# Patient Record
Sex: Male | Born: 1965 | Race: White | Hispanic: No | Marital: Married
Health system: Southern US, Community
[De-identification: ages and names within clinical notes are randomized; demographics above are authoritative.]

## PROBLEM LIST (undated history)

## (undated) DIAGNOSIS — I10 Essential (primary) hypertension: Secondary | ICD-10-CM

## (undated) DIAGNOSIS — F900 Attention-deficit hyperactivity disorder, predominantly inattentive type: Secondary | ICD-10-CM

## (undated) DIAGNOSIS — M51369 Other intervertebral disc degeneration, lumbar region without mention of lumbar back pain or lower extremity pain: Secondary | ICD-10-CM

## (undated) DIAGNOSIS — F439 Reaction to severe stress, unspecified: Secondary | ICD-10-CM

## (undated) DIAGNOSIS — E291 Testicular hypofunction: Secondary | ICD-10-CM

## (undated) DIAGNOSIS — E785 Hyperlipidemia, unspecified: Secondary | ICD-10-CM

## (undated) DIAGNOSIS — Z8639 Personal history of other endocrine, nutritional and metabolic disease: Secondary | ICD-10-CM

## (undated) DIAGNOSIS — E7800 Pure hypercholesterolemia, unspecified: Secondary | ICD-10-CM

## (undated) DIAGNOSIS — R7989 Other specified abnormal findings of blood chemistry: Secondary | ICD-10-CM

## (undated) DIAGNOSIS — M109 Gout, unspecified: Secondary | ICD-10-CM

## (undated) DIAGNOSIS — N400 Enlarged prostate without lower urinary tract symptoms: Secondary | ICD-10-CM

## (undated) DIAGNOSIS — G4733 Obstructive sleep apnea (adult) (pediatric): Secondary | ICD-10-CM

## (undated) DIAGNOSIS — R7303 Prediabetes: Secondary | ICD-10-CM

## (undated) DIAGNOSIS — N529 Male erectile dysfunction, unspecified: Secondary | ICD-10-CM

## (undated) HISTORY — DX: Personal history of other endocrine, nutritional and metabolic disease: Z86.39

## (undated) HISTORY — DX: Other intervertebral disc degeneration, lumbar region without mention of lumbar back pain or lower extremity pain: M51.369

## (undated) HISTORY — DX: Male erectile dysfunction, unspecified: N52.9

## (undated) HISTORY — DX: Prediabetes: R73.03

## (undated) HISTORY — DX: Essential (primary) hypertension: I10

## (undated) HISTORY — DX: Attention-deficit hyperactivity disorder, predominantly inattentive type: F90.0

## (undated) HISTORY — DX: Benign prostatic hyperplasia without lower urinary tract symptoms: N40.0

## (undated) HISTORY — DX: Reaction to severe stress, unspecified: F43.9

## (undated) HISTORY — DX: Obstructive sleep apnea (adult) (pediatric): G47.33

## (undated) HISTORY — DX: Other specified abnormal findings of blood chemistry: R79.89

## (undated) HISTORY — DX: Gout, unspecified: M10.9

## (undated) HISTORY — DX: Pure hypercholesterolemia, unspecified: E78.00

## (undated) HISTORY — DX: Hyperlipidemia, unspecified: E78.5

## (undated) HISTORY — DX: Morbid (severe) obesity due to excess calories: E66.01

## (undated) HISTORY — DX: Testicular hypofunction: E29.1

---

## 2001-07-18 ENCOUNTER — Emergency Department (HOSPITAL_COMMUNITY): Admission: EM | Admit: 2001-07-18 | Discharge: 2001-07-18 | Payer: Self-pay | Admitting: *Deleted

## 2013-09-30 ENCOUNTER — Other Ambulatory Visit: Payer: Self-pay | Admitting: Family Medicine

## 2013-09-30 ENCOUNTER — Ambulatory Visit
Admission: RE | Admit: 2013-09-30 | Discharge: 2013-09-30 | Disposition: A | Discharge status: Home / Self Care | Payer: BC Managed Care – PPO | Source: Ambulatory Visit | Attending: Family Medicine | Admitting: Family Medicine

## 2013-09-30 DIAGNOSIS — M5431 Sciatica, right side: Secondary | ICD-10-CM

## 2014-05-20 ENCOUNTER — Other Ambulatory Visit: Payer: Self-pay | Admitting: Family Medicine

## 2014-05-20 DIAGNOSIS — M5431 Sciatica, right side: Secondary | ICD-10-CM

## 2014-05-31 ENCOUNTER — Ambulatory Visit
Admission: RE | Admit: 2014-05-31 | Discharge: 2014-05-31 | Disposition: A | Discharge status: Home / Self Care | Payer: 59 | Source: Ambulatory Visit | Attending: Family Medicine | Admitting: Family Medicine

## 2014-05-31 DIAGNOSIS — M5431 Sciatica, right side: Secondary | ICD-10-CM

## 2015-08-05 DIAGNOSIS — M4726 Other spondylosis with radiculopathy, lumbar region: Secondary | ICD-10-CM | POA: Diagnosis not present

## 2015-08-05 DIAGNOSIS — M4696 Unspecified inflammatory spondylopathy, lumbar region: Secondary | ICD-10-CM | POA: Diagnosis not present

## 2015-08-05 DIAGNOSIS — Z6836 Body mass index (BMI) 36.0-36.9, adult: Secondary | ICD-10-CM | POA: Diagnosis not present

## 2015-08-05 DIAGNOSIS — I10 Essential (primary) hypertension: Secondary | ICD-10-CM | POA: Diagnosis not present

## 2015-08-17 DIAGNOSIS — M47816 Spondylosis without myelopathy or radiculopathy, lumbar region: Secondary | ICD-10-CM | POA: Diagnosis not present

## 2015-08-17 DIAGNOSIS — Z6836 Body mass index (BMI) 36.0-36.9, adult: Secondary | ICD-10-CM | POA: Diagnosis not present

## 2015-08-17 DIAGNOSIS — I10 Essential (primary) hypertension: Secondary | ICD-10-CM | POA: Diagnosis not present

## 2015-09-02 DIAGNOSIS — Z6836 Body mass index (BMI) 36.0-36.9, adult: Secondary | ICD-10-CM | POA: Diagnosis not present

## 2015-09-02 DIAGNOSIS — M4726 Other spondylosis with radiculopathy, lumbar region: Secondary | ICD-10-CM | POA: Diagnosis not present

## 2015-09-02 DIAGNOSIS — I10 Essential (primary) hypertension: Secondary | ICD-10-CM | POA: Diagnosis not present

## 2015-09-16 DIAGNOSIS — M47816 Spondylosis without myelopathy or radiculopathy, lumbar region: Secondary | ICD-10-CM | POA: Diagnosis not present

## 2015-09-30 DIAGNOSIS — G4733 Obstructive sleep apnea (adult) (pediatric): Secondary | ICD-10-CM | POA: Diagnosis not present

## 2015-12-21 DIAGNOSIS — Z23 Encounter for immunization: Secondary | ICD-10-CM | POA: Diagnosis not present

## 2015-12-21 DIAGNOSIS — Z0000 Encounter for general adult medical examination without abnormal findings: Secondary | ICD-10-CM | POA: Diagnosis not present

## 2015-12-21 DIAGNOSIS — M109 Gout, unspecified: Secondary | ICD-10-CM | POA: Diagnosis not present

## 2015-12-21 DIAGNOSIS — Z125 Encounter for screening for malignant neoplasm of prostate: Secondary | ICD-10-CM | POA: Diagnosis not present

## 2015-12-21 DIAGNOSIS — E291 Testicular hypofunction: Secondary | ICD-10-CM | POA: Diagnosis not present

## 2015-12-21 DIAGNOSIS — E785 Hyperlipidemia, unspecified: Secondary | ICD-10-CM | POA: Diagnosis not present

## 2015-12-21 DIAGNOSIS — I10 Essential (primary) hypertension: Secondary | ICD-10-CM | POA: Diagnosis not present

## 2016-03-16 DIAGNOSIS — R635 Abnormal weight gain: Secondary | ICD-10-CM | POA: Diagnosis not present

## 2016-03-16 DIAGNOSIS — E291 Testicular hypofunction: Secondary | ICD-10-CM | POA: Diagnosis not present

## 2016-03-17 DIAGNOSIS — I10 Essential (primary) hypertension: Secondary | ICD-10-CM | POA: Diagnosis not present

## 2016-03-17 DIAGNOSIS — R7303 Prediabetes: Secondary | ICD-10-CM | POA: Diagnosis not present

## 2016-03-17 DIAGNOSIS — E291 Testicular hypofunction: Secondary | ICD-10-CM | POA: Diagnosis not present

## 2016-03-17 DIAGNOSIS — E538 Deficiency of other specified B group vitamins: Secondary | ICD-10-CM | POA: Diagnosis not present

## 2016-03-17 DIAGNOSIS — E8881 Metabolic syndrome: Secondary | ICD-10-CM | POA: Diagnosis not present

## 2016-03-23 DIAGNOSIS — E291 Testicular hypofunction: Secondary | ICD-10-CM | POA: Diagnosis not present

## 2016-04-22 DIAGNOSIS — E291 Testicular hypofunction: Secondary | ICD-10-CM | POA: Diagnosis not present

## 2016-04-22 DIAGNOSIS — R7303 Prediabetes: Secondary | ICD-10-CM | POA: Diagnosis not present

## 2016-04-22 DIAGNOSIS — E538 Deficiency of other specified B group vitamins: Secondary | ICD-10-CM | POA: Diagnosis not present

## 2016-04-22 DIAGNOSIS — E559 Vitamin D deficiency, unspecified: Secondary | ICD-10-CM | POA: Diagnosis not present

## 2016-04-22 DIAGNOSIS — E8881 Metabolic syndrome: Secondary | ICD-10-CM | POA: Diagnosis not present

## 2016-04-29 DIAGNOSIS — E8881 Metabolic syndrome: Secondary | ICD-10-CM | POA: Diagnosis not present

## 2016-04-29 DIAGNOSIS — E538 Deficiency of other specified B group vitamins: Secondary | ICD-10-CM | POA: Diagnosis not present

## 2016-04-29 DIAGNOSIS — E291 Testicular hypofunction: Secondary | ICD-10-CM | POA: Diagnosis not present

## 2016-05-09 DIAGNOSIS — G4733 Obstructive sleep apnea (adult) (pediatric): Secondary | ICD-10-CM | POA: Diagnosis not present

## 2016-05-17 DIAGNOSIS — G4733 Obstructive sleep apnea (adult) (pediatric): Secondary | ICD-10-CM | POA: Diagnosis not present

## 2016-06-14 DIAGNOSIS — G4733 Obstructive sleep apnea (adult) (pediatric): Secondary | ICD-10-CM | POA: Diagnosis not present

## 2016-06-20 DIAGNOSIS — F900 Attention-deficit hyperactivity disorder, predominantly inattentive type: Secondary | ICD-10-CM | POA: Diagnosis not present

## 2016-06-20 DIAGNOSIS — E7800 Pure hypercholesterolemia, unspecified: Secondary | ICD-10-CM | POA: Diagnosis not present

## 2016-06-20 DIAGNOSIS — E291 Testicular hypofunction: Secondary | ICD-10-CM | POA: Diagnosis not present

## 2016-06-20 DIAGNOSIS — I10 Essential (primary) hypertension: Secondary | ICD-10-CM | POA: Diagnosis not present

## 2016-06-28 DIAGNOSIS — E291 Testicular hypofunction: Secondary | ICD-10-CM | POA: Diagnosis not present

## 2016-06-28 DIAGNOSIS — R945 Abnormal results of liver function studies: Secondary | ICD-10-CM | POA: Diagnosis not present

## 2016-06-28 DIAGNOSIS — R7301 Impaired fasting glucose: Secondary | ICD-10-CM | POA: Diagnosis not present

## 2016-06-28 DIAGNOSIS — I10 Essential (primary) hypertension: Secondary | ICD-10-CM | POA: Diagnosis not present

## 2016-07-13 DIAGNOSIS — E291 Testicular hypofunction: Secondary | ICD-10-CM | POA: Diagnosis not present

## 2016-07-15 DIAGNOSIS — G4733 Obstructive sleep apnea (adult) (pediatric): Secondary | ICD-10-CM | POA: Diagnosis not present

## 2016-08-08 DIAGNOSIS — G4733 Obstructive sleep apnea (adult) (pediatric): Secondary | ICD-10-CM | POA: Diagnosis not present

## 2016-08-14 DIAGNOSIS — G4733 Obstructive sleep apnea (adult) (pediatric): Secondary | ICD-10-CM | POA: Diagnosis not present

## 2016-09-14 DIAGNOSIS — G4733 Obstructive sleep apnea (adult) (pediatric): Secondary | ICD-10-CM | POA: Diagnosis not present

## 2016-10-11 IMAGING — MR MR LUMBAR SPINE W/O CM
4 of 5 series · 18 of 48 positions shown · non-contrast
Comparison: 09/30/2013

CLINICAL DATA: Low back pain and weakness. Pain extending down the
right leg for 1 year.

EXAM:
MRI LUMBAR SPINE WITHOUT CONTRAST
TECHNIQUE: Multiplanar, multisequence MR imaging of the lumbar spine was
performed. No intravenous contrast was administered.

[Series 3: T1 · sagittal · 4.0mm · 0.84mm/px · 3 of 14 slices shown (1 of 2)]
[im 1/14]
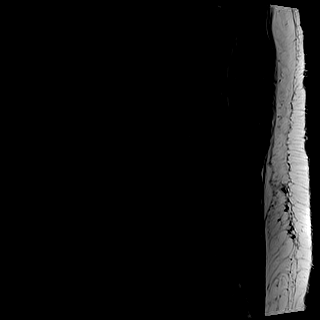
[im 7/14]
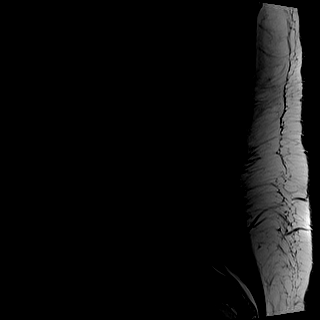
[im 14/14]
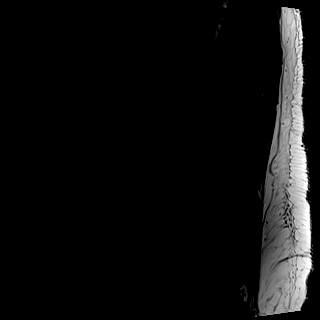

[Series 4: T2 · sagittal · 4.0mm · 0.42mm/px · 5 of 14 slices shown (1 of 2)]
[im 1/14]
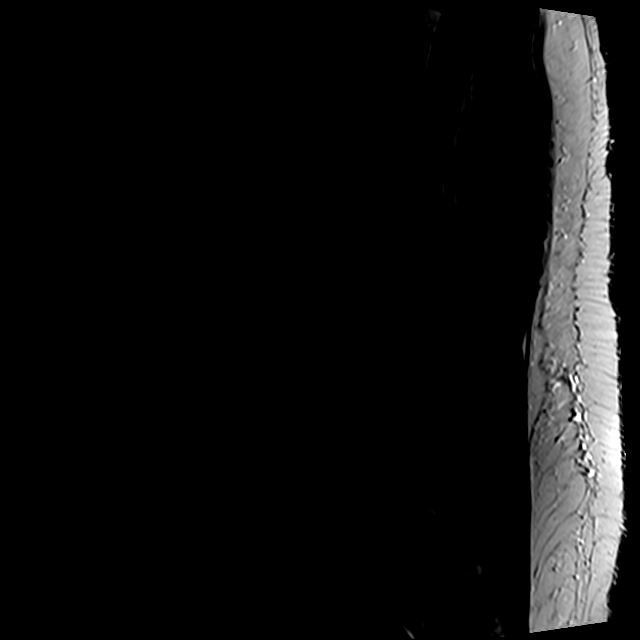
[im 4/14]
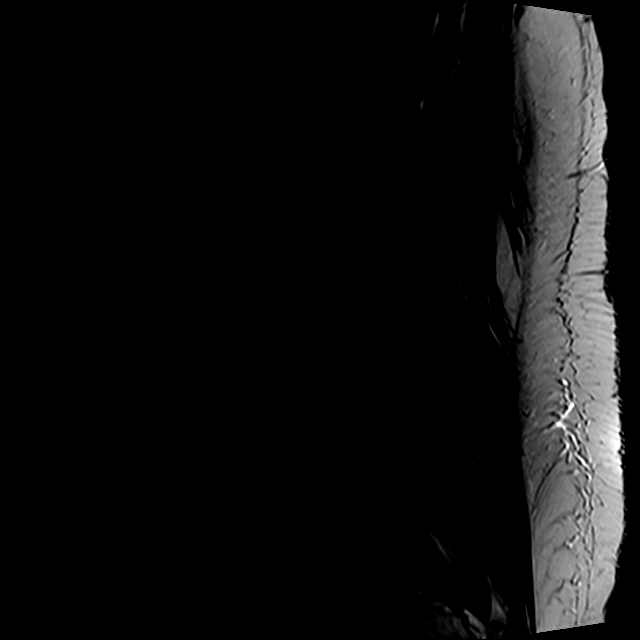
[im 7/14]
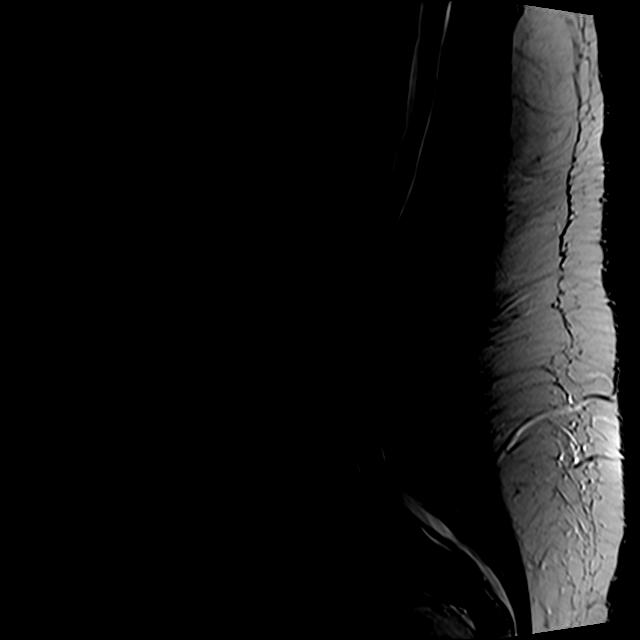
[im 10/14]
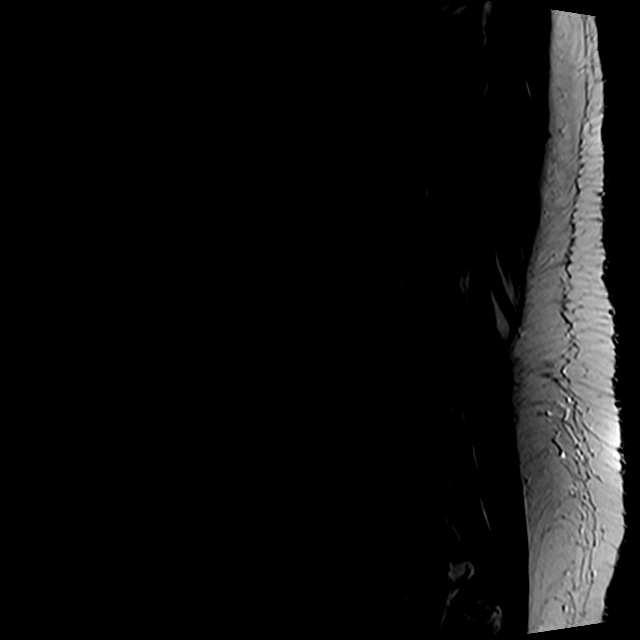
[im 14/14]
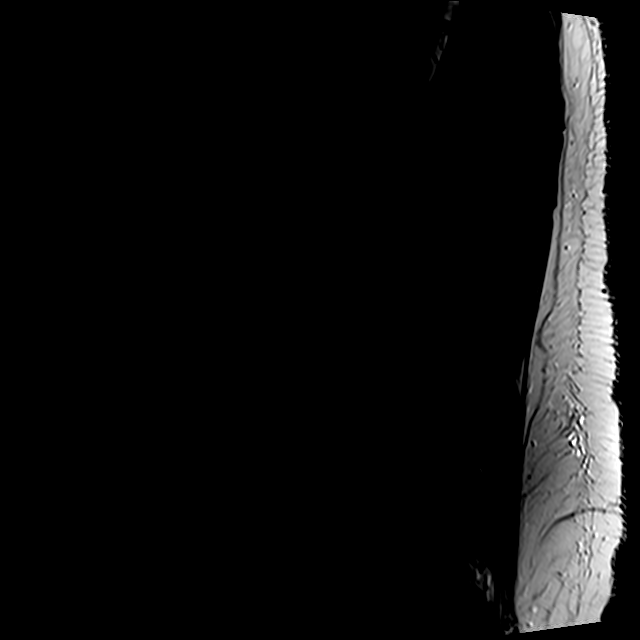

[Series 5: T2 · axial · 4.0mm · 0.39mm/px · z∈[-37,+134]mm · 7 of 40 slices shown (2 of 2)]
[im 3/40]
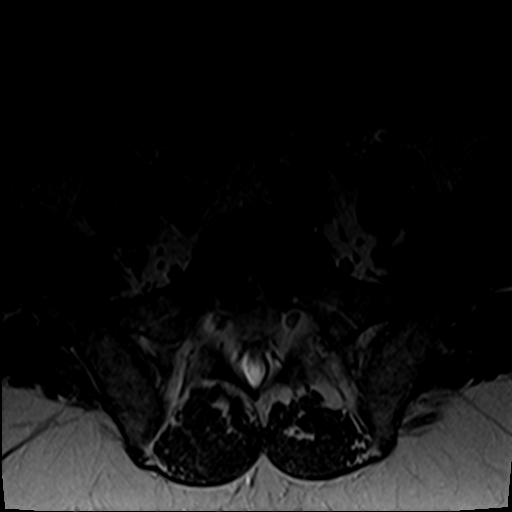
[im 6/40]
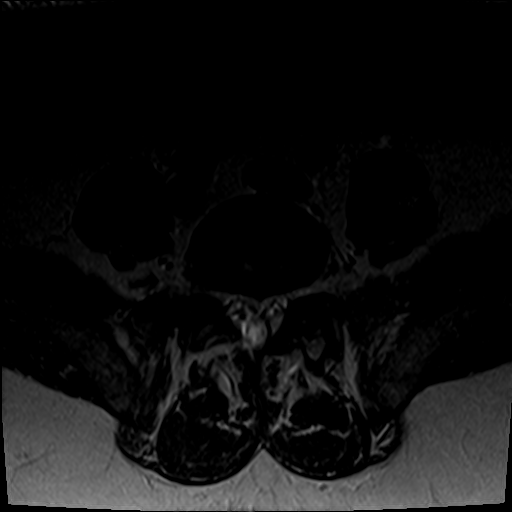
[im 8/40]
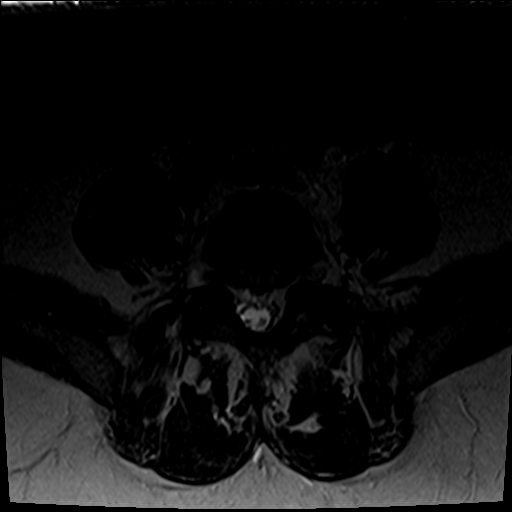
[im 14/40]
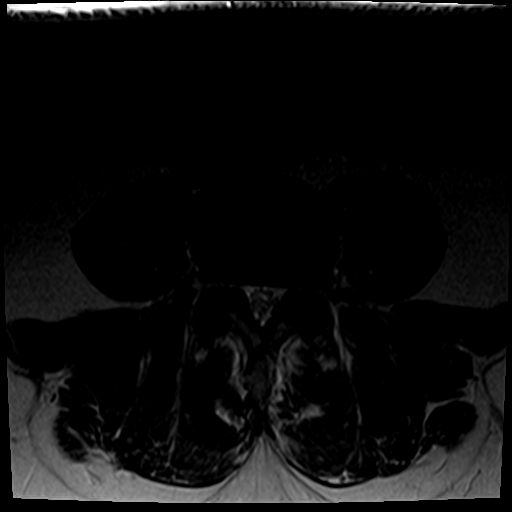
[im 19/40]
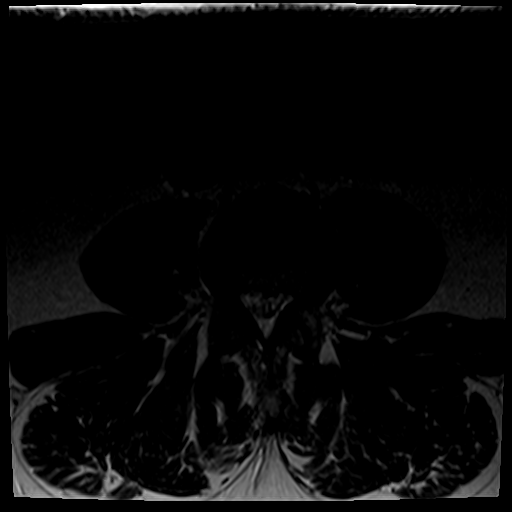
[im 21/40]
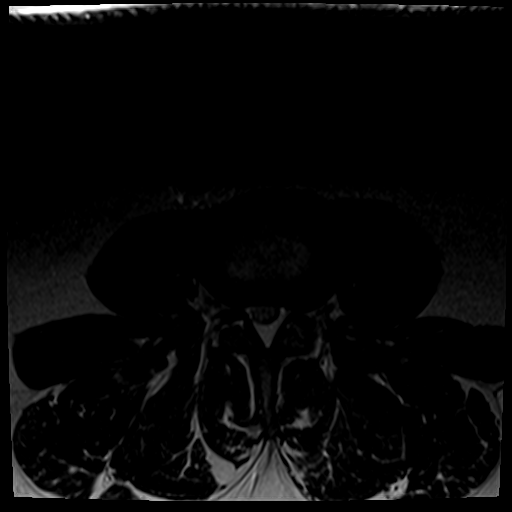
[im 34/40]
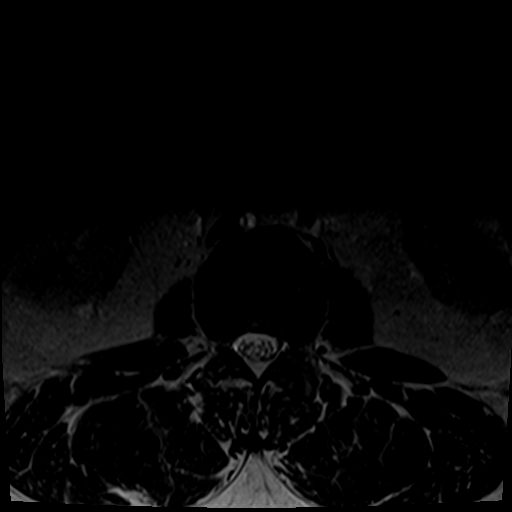

[Series 7: T1 · axial · 4.0mm · 0.39mm/px · z∈[-22,+134]mm · 3 of 40 slices shown (2 of 2)]
[im 6/40]
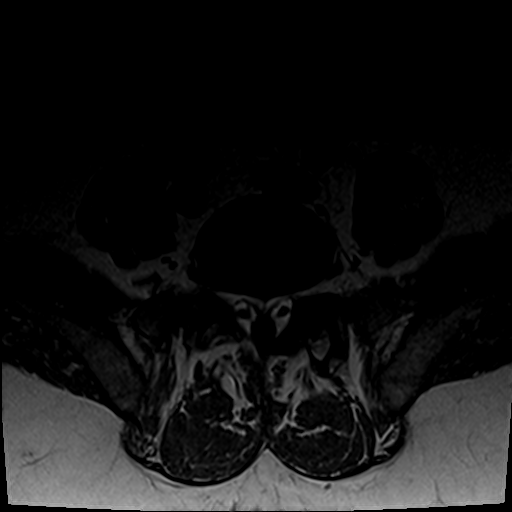
[im 21/40]
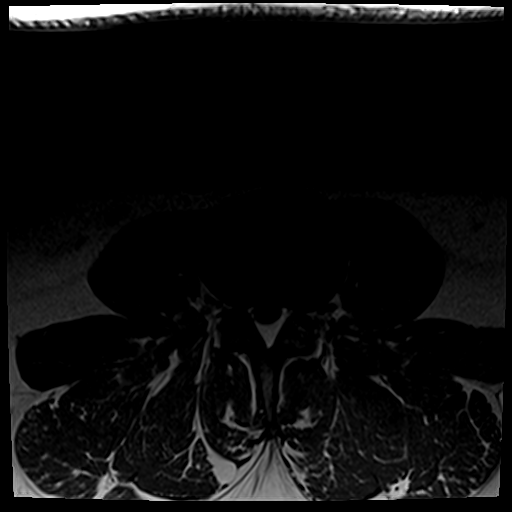
[im 34/40]
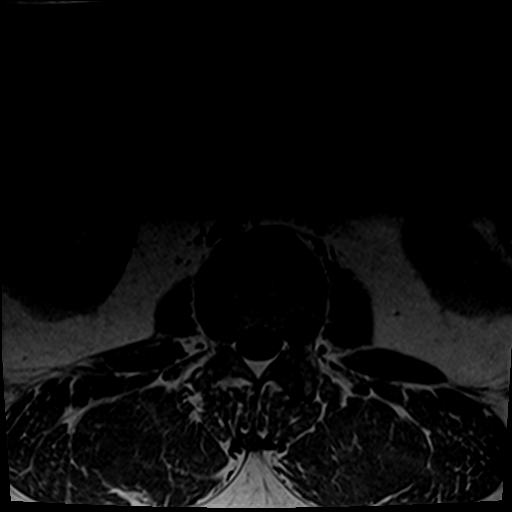

[18 of 48 positions shown; findings below may reference images not displayed]

FINDINGS: The lowest lumbar type non-rib-bearing vertebra is labeled as L5.
The conus medullaris appears normal. Conus level: L1.

Mild disc desiccation at L4-5 and L5-S 1, without loss of disc
height.

Degenerative facet edema bilaterally at L4-5. Otherwise, No
significant vertebral marrow edema is identified.

Mildly prominent epidural adipose tissues throughout the lumbar
spine, with congenitally short pedicles in the lumbar spine.

There is borderline left foraminal stenosis at T12-L1 due to facet
arthropathy. Additional findings at individual levels are as
follows:

L1-2:  Unremarkable.

L2-3: Mild central narrowing of the thecal sac due to the
congenitally short pedicles.

L3-4: Moderate central narrowing of the thecal sac due to disc bulge
and congenitally short pedicles.

L4-5: Moderate bilateral foraminal stenosis and mild central
narrowing of the thecal sac with mild bilateral subarticular lateral
recess stenosis due to is bulge, facet arthropathy, and congenitally
short pedicles.

L5-S1: Moderate left and mild right foraminal stenosis due to left
greater than right facet arthropathy and a mild disc bulge.
IMPRESSION: 1. Congenitally short pedicles, lumbar spondylosis, and degenerative
disc disease cause moderate impingement at L3-4, L4-5, and L5-S1;
and mild impingement at L2-3, as detailed above.

## 2016-10-14 DIAGNOSIS — G4733 Obstructive sleep apnea (adult) (pediatric): Secondary | ICD-10-CM | POA: Diagnosis not present

## 2016-10-17 DIAGNOSIS — I10 Essential (primary) hypertension: Secondary | ICD-10-CM | POA: Diagnosis not present

## 2016-10-17 DIAGNOSIS — E291 Testicular hypofunction: Secondary | ICD-10-CM | POA: Diagnosis not present

## 2016-10-17 DIAGNOSIS — E7800 Pure hypercholesterolemia, unspecified: Secondary | ICD-10-CM | POA: Diagnosis not present

## 2016-10-17 DIAGNOSIS — Z23 Encounter for immunization: Secondary | ICD-10-CM | POA: Diagnosis not present

## 2016-10-17 DIAGNOSIS — F900 Attention-deficit hyperactivity disorder, predominantly inattentive type: Secondary | ICD-10-CM | POA: Diagnosis not present

## 2016-10-17 DIAGNOSIS — E119 Type 2 diabetes mellitus without complications: Secondary | ICD-10-CM | POA: Diagnosis not present

## 2016-11-17 DIAGNOSIS — E291 Testicular hypofunction: Secondary | ICD-10-CM | POA: Diagnosis not present

## 2017-01-24 DIAGNOSIS — G4733 Obstructive sleep apnea (adult) (pediatric): Secondary | ICD-10-CM | POA: Diagnosis not present

## 2017-03-10 DIAGNOSIS — Z23 Encounter for immunization: Secondary | ICD-10-CM | POA: Diagnosis not present

## 2017-05-16 DIAGNOSIS — I10 Essential (primary) hypertension: Secondary | ICD-10-CM | POA: Diagnosis not present

## 2017-05-16 DIAGNOSIS — Z125 Encounter for screening for malignant neoplasm of prostate: Secondary | ICD-10-CM | POA: Diagnosis not present

## 2017-05-16 DIAGNOSIS — E7800 Pure hypercholesterolemia, unspecified: Secondary | ICD-10-CM | POA: Diagnosis not present

## 2017-05-16 DIAGNOSIS — Z0000 Encounter for general adult medical examination without abnormal findings: Secondary | ICD-10-CM | POA: Diagnosis not present

## 2017-05-16 DIAGNOSIS — E291 Testicular hypofunction: Secondary | ICD-10-CM | POA: Diagnosis not present

## 2017-05-16 DIAGNOSIS — E119 Type 2 diabetes mellitus without complications: Secondary | ICD-10-CM | POA: Diagnosis not present

## 2017-07-07 DIAGNOSIS — K640 First degree hemorrhoids: Secondary | ICD-10-CM | POA: Diagnosis not present

## 2017-07-07 DIAGNOSIS — D123 Benign neoplasm of transverse colon: Secondary | ICD-10-CM | POA: Diagnosis not present

## 2017-07-07 DIAGNOSIS — Z86010 Personal history of colonic polyps: Secondary | ICD-10-CM | POA: Diagnosis not present

## 2017-08-09 DIAGNOSIS — G4733 Obstructive sleep apnea (adult) (pediatric): Secondary | ICD-10-CM | POA: Diagnosis not present

## 2017-11-14 DIAGNOSIS — M109 Gout, unspecified: Secondary | ICD-10-CM | POA: Diagnosis not present

## 2017-11-14 DIAGNOSIS — E7800 Pure hypercholesterolemia, unspecified: Secondary | ICD-10-CM | POA: Diagnosis not present

## 2017-11-14 DIAGNOSIS — E1169 Type 2 diabetes mellitus with other specified complication: Secondary | ICD-10-CM | POA: Diagnosis not present

## 2017-11-14 DIAGNOSIS — E291 Testicular hypofunction: Secondary | ICD-10-CM | POA: Diagnosis not present

## 2017-11-14 DIAGNOSIS — I10 Essential (primary) hypertension: Secondary | ICD-10-CM | POA: Diagnosis not present

## 2018-06-18 DIAGNOSIS — E7800 Pure hypercholesterolemia, unspecified: Secondary | ICD-10-CM | POA: Diagnosis not present

## 2018-06-18 DIAGNOSIS — N529 Male erectile dysfunction, unspecified: Secondary | ICD-10-CM | POA: Diagnosis not present

## 2018-06-18 DIAGNOSIS — E291 Testicular hypofunction: Secondary | ICD-10-CM | POA: Diagnosis not present

## 2018-06-18 DIAGNOSIS — Z0000 Encounter for general adult medical examination without abnormal findings: Secondary | ICD-10-CM | POA: Diagnosis not present

## 2018-06-18 DIAGNOSIS — E1169 Type 2 diabetes mellitus with other specified complication: Secondary | ICD-10-CM | POA: Diagnosis not present

## 2018-06-18 DIAGNOSIS — I10 Essential (primary) hypertension: Secondary | ICD-10-CM | POA: Diagnosis not present

## 2018-06-18 DIAGNOSIS — Z125 Encounter for screening for malignant neoplasm of prostate: Secondary | ICD-10-CM | POA: Diagnosis not present

## 2018-07-03 DIAGNOSIS — R972 Elevated prostate specific antigen [PSA]: Secondary | ICD-10-CM | POA: Diagnosis not present

## 2018-07-03 DIAGNOSIS — N486 Induration penis plastica: Secondary | ICD-10-CM | POA: Diagnosis not present

## 2018-07-03 DIAGNOSIS — N5201 Erectile dysfunction due to arterial insufficiency: Secondary | ICD-10-CM | POA: Diagnosis not present

## 2018-08-15 DIAGNOSIS — G4733 Obstructive sleep apnea (adult) (pediatric): Secondary | ICD-10-CM | POA: Diagnosis not present

## 2018-10-01 DIAGNOSIS — G4733 Obstructive sleep apnea (adult) (pediatric): Secondary | ICD-10-CM | POA: Diagnosis not present

## 2018-12-24 DIAGNOSIS — R972 Elevated prostate specific antigen [PSA]: Secondary | ICD-10-CM | POA: Diagnosis not present

## 2018-12-24 DIAGNOSIS — E291 Testicular hypofunction: Secondary | ICD-10-CM | POA: Diagnosis not present

## 2019-06-27 ENCOUNTER — Ambulatory Visit: Payer: BC Managed Care – PPO | Attending: Internal Medicine

## 2019-06-27 DIAGNOSIS — Z23 Encounter for immunization: Secondary | ICD-10-CM

## 2019-06-27 NOTE — Progress Notes (Signed)
   Covid-19 Vaccination Clinic  Name:  Kevin Dudley    MRN: [id number] DOB: [birth year]  06/27/2019  [name] was observed post Covid-19 immunization for 15 minutes without incident. He was provided with Vaccine Information Sheet and instruction to access the V-Safe system.   [name] was instructed to call 911 with any severe reactions post vaccine: Marland Kitchen Difficulty breathing  . Swelling of face and throat  . A fast heartbeat  . A bad rash all over body  . Dizziness and weakness   Immunizations Administered    Name Date Dose VIS Date Route   Pfizer COVID-19 Vaccine 06/27/2019 12:41 PM 0.3 mL 03/15/2019 Intramuscular   Manufacturer: [employer], [employer]   Lot: [id number]   NDC: [address]-5

## 2019-07-08 ENCOUNTER — Ambulatory Visit: Payer: Self-pay

## 2019-07-22 ENCOUNTER — Ambulatory Visit: Payer: BC Managed Care – PPO | Attending: Internal Medicine

## 2019-07-22 DIAGNOSIS — Z23 Encounter for immunization: Secondary | ICD-10-CM

## 2019-07-22 NOTE — Progress Notes (Signed)
   Covid-19 Vaccination Clinic  Name:  Kevin Dudley    MRN: [id number] DOB: 28-Oct-1965  07/22/2019  [name] was observed post Covid-19 immunization for 15 minutes without incident. He was provided with Vaccine Information Sheet and instruction to access the V-Safe system.   [name] was instructed to call 911 with any severe reactions post vaccine: Marland Kitchen Difficulty breathing  . Swelling of face and throat  . A fast heartbeat  . A bad rash all over body  . Dizziness and weakness   Immunizations Administered    Name Date Dose VIS Date Route   Pfizer COVID-19 Vaccine 07/22/2019 12:44 PM 0.3 mL 05/29/2018 Intramuscular   Manufacturer: [employer], [employer]   Lot: [id number]   NDC: [address]-7

## 2019-07-30 DIAGNOSIS — Z0000 Encounter for general adult medical examination without abnormal findings: Secondary | ICD-10-CM | POA: Diagnosis not present

## 2019-07-30 DIAGNOSIS — E291 Testicular hypofunction: Secondary | ICD-10-CM | POA: Diagnosis not present

## 2019-07-30 DIAGNOSIS — R972 Elevated prostate specific antigen [PSA]: Secondary | ICD-10-CM | POA: Diagnosis not present

## 2019-07-30 DIAGNOSIS — N529 Male erectile dysfunction, unspecified: Secondary | ICD-10-CM | POA: Diagnosis not present

## 2019-07-30 DIAGNOSIS — E7800 Pure hypercholesterolemia, unspecified: Secondary | ICD-10-CM | POA: Diagnosis not present

## 2019-07-30 DIAGNOSIS — E1169 Type 2 diabetes mellitus with other specified complication: Secondary | ICD-10-CM | POA: Diagnosis not present

## 2019-07-30 DIAGNOSIS — I10 Essential (primary) hypertension: Secondary | ICD-10-CM | POA: Diagnosis not present

## 2019-08-19 DIAGNOSIS — G4733 Obstructive sleep apnea (adult) (pediatric): Secondary | ICD-10-CM | POA: Diagnosis not present

## 2019-08-20 DIAGNOSIS — G4733 Obstructive sleep apnea (adult) (pediatric): Secondary | ICD-10-CM | POA: Diagnosis not present

## 2023-04-17 ENCOUNTER — Encounter: Payer: Self-pay | Admitting: *Deleted

## 2023-04-18 ENCOUNTER — Encounter: Payer: Self-pay | Admitting: Diagnostic Neuroimaging

## 2023-04-18 ENCOUNTER — Ambulatory Visit (INDEPENDENT_AMBULATORY_CARE_PROVIDER_SITE_OTHER): Payer: Commercial Managed Care - HMO | Admitting: Diagnostic Neuroimaging

## 2023-04-18 VITALS — BP 162/91 | HR 85 | Ht 75.0 in | Wt 338.0 lb

## 2023-04-18 DIAGNOSIS — G250 Essential tremor: Secondary | ICD-10-CM | POA: Diagnosis not present

## 2023-04-18 DIAGNOSIS — R200 Anesthesia of skin: Secondary | ICD-10-CM | POA: Diagnosis not present

## 2023-04-18 DIAGNOSIS — R202 Paresthesia of skin: Secondary | ICD-10-CM

## 2023-04-18 NOTE — Progress Notes (Signed)
 [facility]  PATIENT: Kevin Dudley DOB: Sep 09, 1965  REFERRING CLINICIAN: Seabron Lenis, MD HISTORY FROM: patient  REASON FOR VISIT: new consult   HISTORICAL  CHIEF COMPLAINT:  Chief Complaint  Patient presents with   New Patient (Initial Visit)    Pt in 7 alone Pt states tremors in both hands Pt states burning ,tingling, and numbness in both feet  Pt states pain starts at calf and travels to feet     HISTORY OF PRESENT ILLNESS:   58 year old male here for evaluation of numbness and tingling in the feet and legs.  Symptoms started around 6 months ago and have progressed from his feet up to his calves.  He describes numbness, tingling, pins-and-needles and burning sensation.  He has some remote history of right sciatica which improved after weight loss regimen.  Patient has gained weight recently prior to onset of these numbness and tingling in symptoms.  Has also noted some mild postural tremor in upper extremities.  Has family history of tremor in 1 older sister.  Another sister has MS.   REVIEW OF SYSTEMS: Full 14 system review of systems performed and negative with exception of: as per HPI.  ALLERGIES: Not on File  HOME MEDICATIONS: Outpatient Medications Prior to Visit  Medication Sig Dispense Refill   acetaminophen (TYLENOL) 650 MG CR tablet Take 650 mg by mouth every 8 (eight) hours as needed for pain.     amLODipine (NORVASC) 10 MG tablet Take 10 mg by mouth daily.     losartan-hydrochlorothiazide (HYZAAR) 100-25 MG tablet Take 1 tablet by mouth daily.     sildenafil (REVATIO) 20 MG tablet Take 20 mg by mouth 3 (three) times daily. Take 1-5 tablets by month once daily 1 hour prior to need for 30 days.     No facility-administered medications prior to visit.    PAST MEDICAL HISTORY: Past Medical History:  Diagnosis Date   Attention deficit hyperactivity disorder, inattentive type    Benign prostatic hyperplasia    Degenerative disc disease,  lumbar    Elevated LDL cholesterol level    Erectile dysfunction    Gout, unspecified    History of diabetes mellitus    Hyperlipidemia    Low testosterone     Morbid obesity (HCC)    OSA (obstructive sleep apnea)    Pre-diabetes    Primary hypertension    Situational stress    Testicular hypofunction     PAST SURGICAL HISTORY: History reviewed. No pertinent surgical history.  FAMILY HISTORY: Family History  Problem Relation Age of Onset   Multiple sclerosis Sister    Parkinson's disease Neg Hx    Tremor Neg Hx    Neuropathy Neg Hx     SOCIAL HISTORY: Social History   Socioeconomic History   Marital status: Married    Spouse name: Not on file   Number of children: Not on file   Years of education: Not on file   Highest education level: Not on file  Occupational History   Not on file  Tobacco Use   Smoking status: Never   Smokeless tobacco: Former  Advertising Account Planner   Vaping status: Not on file  Substance and Sexual Activity   Alcohol use: Yes    Alcohol/week: 14.0 standard drinks of alcohol    Types: 7 Glasses of wine, 7 Shots of liquor per week   Drug use: Never   Sexual activity: Not on file  Other Topics Concern   Not on file  Social  History Narrative   Lives with wife    Pt works    Social Drivers of Corporate Investment Banker Strain: Not on file  Food Insecurity: No Food Insecurity (04/22/2021)   Received from [facility]   Hunger Vital Sign    Worried About Running Out of Food in the Last Year: Never true    Ran Out of Food in the Last Year: Never true  Transportation Needs: Not on file  Physical Activity: Not on file  Stress: Not on file  Social Connections: Not on file  Intimate Partner Violence: Not on file     PHYSICAL EXAM  GENERAL EXAM/CONSTITUTIONAL: Vitals:  Vitals:   04/18/23 1051  BP: (!) 162/91  Pulse: 85  Weight: (!) 338 lb (153.3 kg)  Height: 6' 3 (1.905 m)   Body mass index is 42.25 kg/m. Wt Readings from Last 3  Encounters:  04/18/23 (!) 338 lb (153.3 kg)   Patient is in no distress; well developed, nourished and groomed; neck is supple  CARDIOVASCULAR: Examination of carotid arteries is normal; no carotid bruits Regular rate and rhythm, no murmurs Examination of peripheral vascular system by observation and palpation is normal  EYES: Ophthalmoscopic exam of optic discs and posterior segments is normal; no papilledema or hemorrhages No results found.  MUSCULOSKELETAL: Gait, strength, tone, movements noted in Neurologic exam below  NEUROLOGIC: MENTAL STATUS:      No data to display         awake, alert, oriented to person, place and time recent and remote memory intact normal attention and concentration language fluent, comprehension intact, naming intact fund of knowledge appropriate  CRANIAL NERVE:  2nd - no papilledema on fundoscopic exam 2nd, 3rd, 4th, 6th - pupils equal and reactive to light, visual fields full to confrontation, extraocular muscles intact, no nystagmus 5th - facial sensation symmetric 7th - facial strength symmetric 8th - hearing intact 9th - palate elevates symmetrically, uvula midline 11th - shoulder shrug symmetric 12th - tongue protrusion midline  MOTOR:  normal bulk and tone, full strength in the BUE, BLE; RIGHT PLANTAR FLEXION WEAKNESS 3-4 MILD POSTURAL TREMOR  SENSORY:  normal and symmetric to light touch, temperature, vibration  COORDINATION:  finger-nose-finger, fine finger movements normal  REFLEXES:  deep tendon reflexes TRACE and symmetric  GAIT/STATION:  narrow based gait; DIFF WITH STANDING ON RIGHT TOES; ABLE TO WALK ON HEELS; romberg is negative     DIAGNOSTIC DATA (LABS, IMAGING, TESTING) - I reviewed patient records, labs, notes, testing and imaging myself where available.  No results found for: WBC, HGB, HCT, MCV, PLT No results found for: NA, K, CL, CO2, GLUCOSE, BUN, CREATININE, CALCIUM,  PROT, ALBUMIN, AST, ALT, ALKPHOS, BILITOT, GFRNONAA, GFRAA No results found for: CHOL, HDL, LDLCALC, LDLDIRECT, TRIG, CHOLHDL No results found for: YHAJ8R No results found for: VITAMINB12 No results found for: TSH   05/31/14 MRI lumbar spine [I reviewed images myself and agree with interpretation. -VRP]  1. Congenitally short pedicles, lumbar spondylosis, and degenerative disc disease cause moderate impingement at L3-4, L4-5, and L5-S1; and mild impingement at L2-3, as detailed above.   ASSESSMENT AND PLAN  58 y.o. year old male here with:   Dx:  1. Numbness and tingling of both feet   2. Essential tremor      PLAN:  BILATERAL FOOT / LEG NUMBNESS (worsening in last 1-2 years; in setting of weight gain) - consider MRI lumbar spine - consider EMG/NCS  ESSENTIAL TREMOR  - mild; monitor  Return for pending if symptoms worsen or fail to improve.    Kevin FABIENE HANLON, MD 04/18/2023, 11:49 AM Certified in Neurology, Neurophysiology and Neuroimaging  [facility] [address], [address] [place], [place] [address] [phone]

## 2023-04-18 NOTE — Patient Instructions (Signed)
  BILATERAL FOOT / LEG NUMBNESS (worsening in last 1-2 years; in setting of weight gain) - consider MRI lumbar spine - consider EMG/NCS  ESSENTIAL TREMOR  - mild; monitor

## 2023-07-24 ENCOUNTER — Other Ambulatory Visit: Payer: Self-pay | Admitting: Family Medicine

## 2023-07-24 DIAGNOSIS — R7989 Other specified abnormal findings of blood chemistry: Secondary | ICD-10-CM

## 2023-08-02 ENCOUNTER — Other Ambulatory Visit

## 2023-08-08 ENCOUNTER — Ambulatory Visit
Admission: RE | Admit: 2023-08-08 | Discharge: 2023-08-08 | Disposition: A | Discharge status: Home / Self Care | Source: Ambulatory Visit | Attending: Family Medicine | Admitting: Family Medicine

## 2023-08-08 DIAGNOSIS — R7989 Other specified abnormal findings of blood chemistry: Secondary | ICD-10-CM
